# Patient Record
Sex: Female | Born: 1970 | Race: White | Hispanic: No | Marital: Single | Smoking: Never smoker
Health system: Southern US, Community
[De-identification: ages and names within clinical notes are randomized; demographics above are authoritative.]

## PROBLEM LIST (undated history)

## (undated) DIAGNOSIS — F419 Anxiety disorder, unspecified: Secondary | ICD-10-CM

## (undated) DIAGNOSIS — G4700 Insomnia, unspecified: Secondary | ICD-10-CM

## (undated) DIAGNOSIS — R519 Headache, unspecified: Secondary | ICD-10-CM

## (undated) DIAGNOSIS — N920 Excessive and frequent menstruation with regular cycle: Secondary | ICD-10-CM

## (undated) DIAGNOSIS — J45909 Unspecified asthma, uncomplicated: Secondary | ICD-10-CM

## (undated) DIAGNOSIS — D649 Anemia, unspecified: Secondary | ICD-10-CM

## (undated) DIAGNOSIS — F32A Depression, unspecified: Secondary | ICD-10-CM

## (undated) HISTORY — PX: TONSILLECTOMY: SUR1361

---

## 2003-06-22 ENCOUNTER — Other Ambulatory Visit: Admission: RE | Admit: 2003-06-22 | Discharge: 2003-06-22 | Payer: Self-pay | Admitting: Obstetrics and Gynecology

## 2004-08-05 ENCOUNTER — Other Ambulatory Visit: Admission: RE | Admit: 2004-08-05 | Discharge: 2004-08-05 | Payer: Self-pay | Admitting: *Deleted

## 2017-12-08 ENCOUNTER — Other Ambulatory Visit: Payer: Self-pay | Admitting: Family Medicine

## 2017-12-08 DIAGNOSIS — Z1231 Encounter for screening mammogram for malignant neoplasm of breast: Secondary | ICD-10-CM

## 2018-05-14 ENCOUNTER — Other Ambulatory Visit: Payer: Self-pay | Admitting: Family Medicine

## 2018-05-14 DIAGNOSIS — Z1231 Encounter for screening mammogram for malignant neoplasm of breast: Secondary | ICD-10-CM

## 2018-09-17 ENCOUNTER — Ambulatory Visit
Admission: RE | Admit: 2018-09-17 | Discharge: 2018-09-17 | Disposition: A | Discharge status: Home / Self Care | Payer: Federal, State, Local not specified - PPO | Source: Ambulatory Visit | Attending: Family Medicine | Admitting: Family Medicine

## 2018-09-17 DIAGNOSIS — Z1231 Encounter for screening mammogram for malignant neoplasm of breast: Secondary | ICD-10-CM | POA: Insufficient documentation

## 2020-05-28 ENCOUNTER — Other Ambulatory Visit: Payer: Self-pay | Admitting: Family Medicine

## 2020-06-27 ENCOUNTER — Other Ambulatory Visit: Payer: Self-pay | Admitting: Family Medicine

## 2020-06-27 DIAGNOSIS — Z1231 Encounter for screening mammogram for malignant neoplasm of breast: Secondary | ICD-10-CM

## 2020-07-16 ENCOUNTER — Other Ambulatory Visit: Payer: Self-pay

## 2020-07-16 ENCOUNTER — Ambulatory Visit
Admission: RE | Admit: 2020-07-16 | Discharge: 2020-07-16 | Disposition: A | Discharge status: Home / Self Care | Payer: Federal, State, Local not specified - PPO | Source: Ambulatory Visit | Attending: Family Medicine | Admitting: Family Medicine

## 2020-07-16 DIAGNOSIS — Z1231 Encounter for screening mammogram for malignant neoplasm of breast: Secondary | ICD-10-CM | POA: Insufficient documentation

## 2020-09-25 IMAGING — MG DIGITAL SCREENING BILATERAL MAMMOGRAM WITH TOMO AND CAD
8 series · 8 of 24 positions shown · non-contrast
Comparison: Previous exam(s).

CLINICAL DATA: Screening.

EXAM:
DIGITAL SCREENING BILATERAL MAMMOGRAM WITH TOMO AND CAD

[R MLO synth-2D]
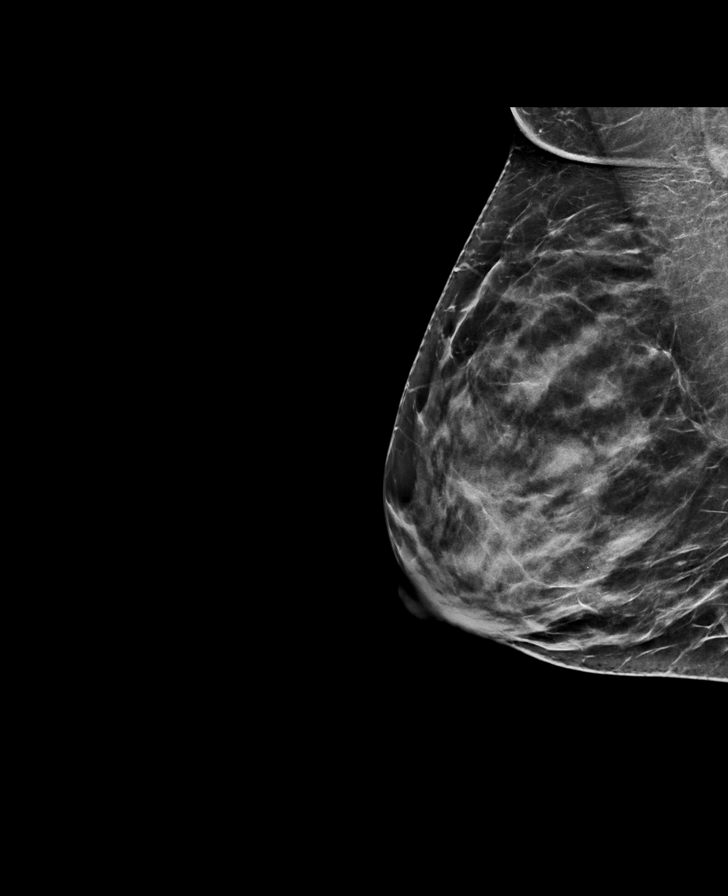

[L MLO synth-2D]
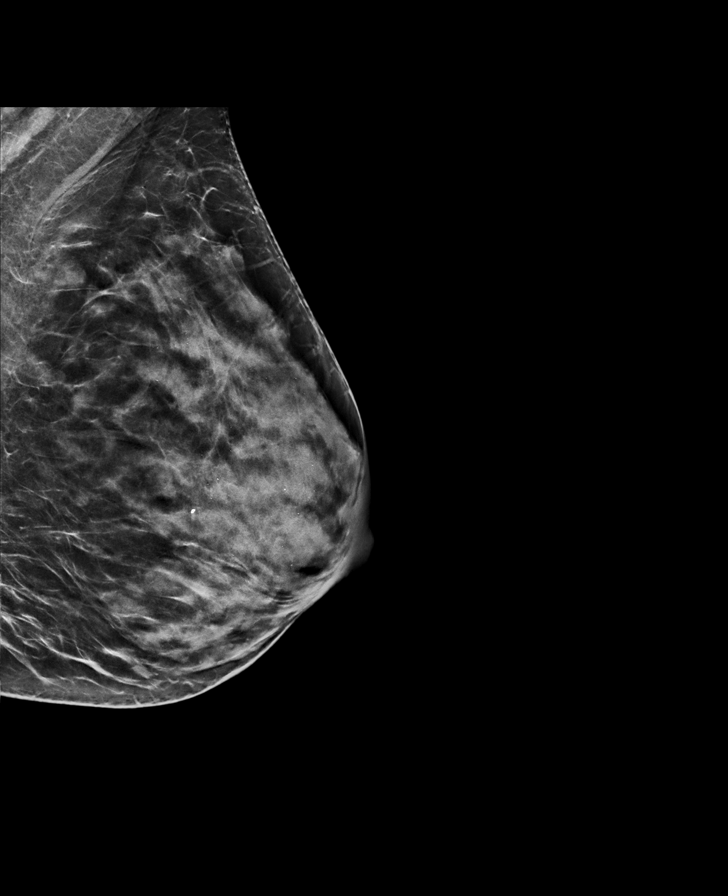

[L CC synth-2D]
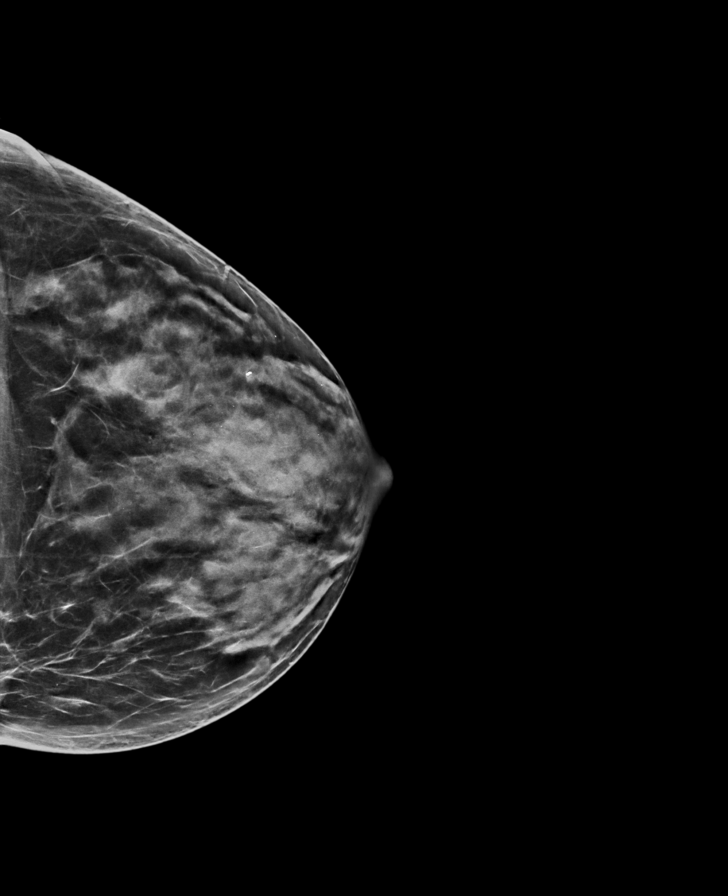

[R CC synth-2D]
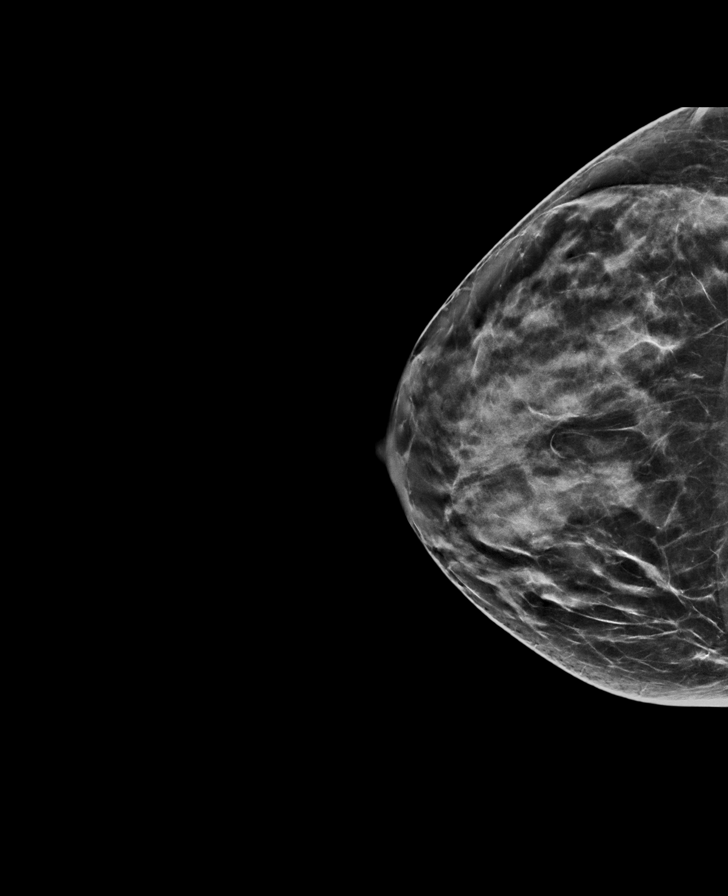

[R MLO tomo · tomo slice 34/67.0]
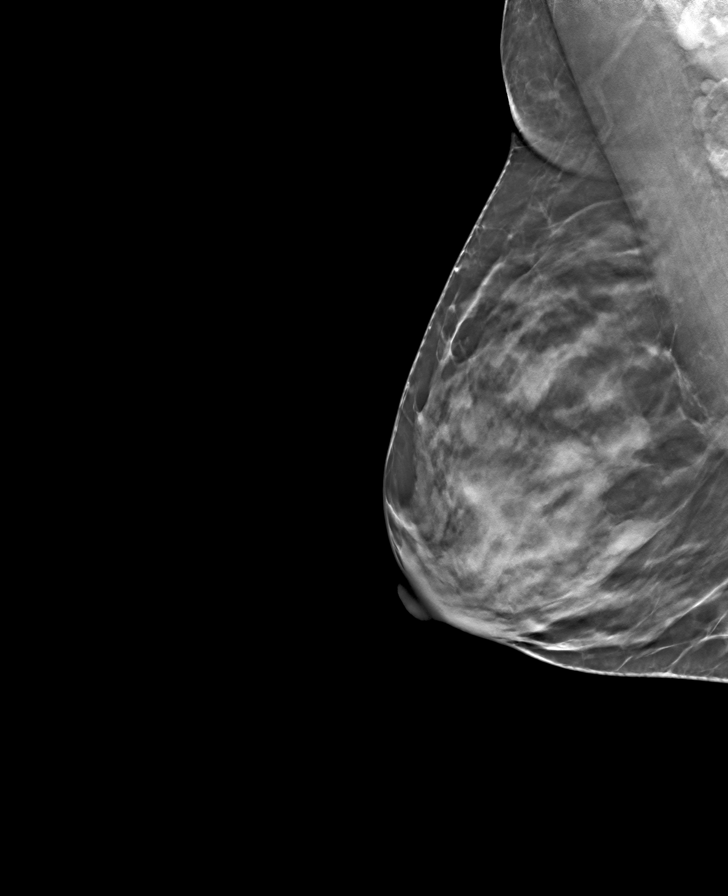

[L CC tomo · tomo slice 35/70.0]
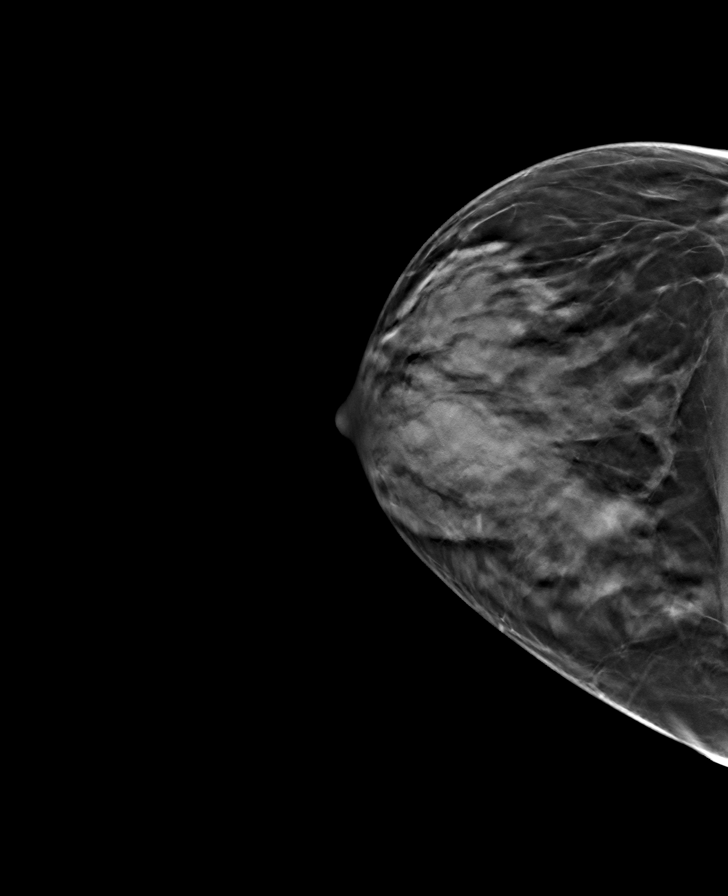

[R CC tomo · tomo slice 37/72.0]
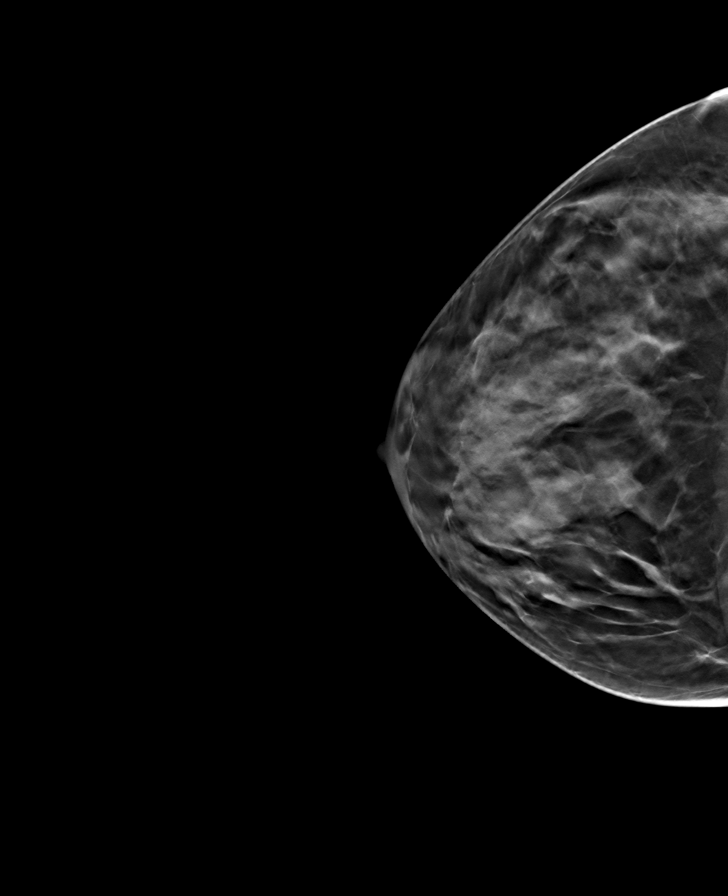

[L MLO tomo · tomo slice 35/68.0]
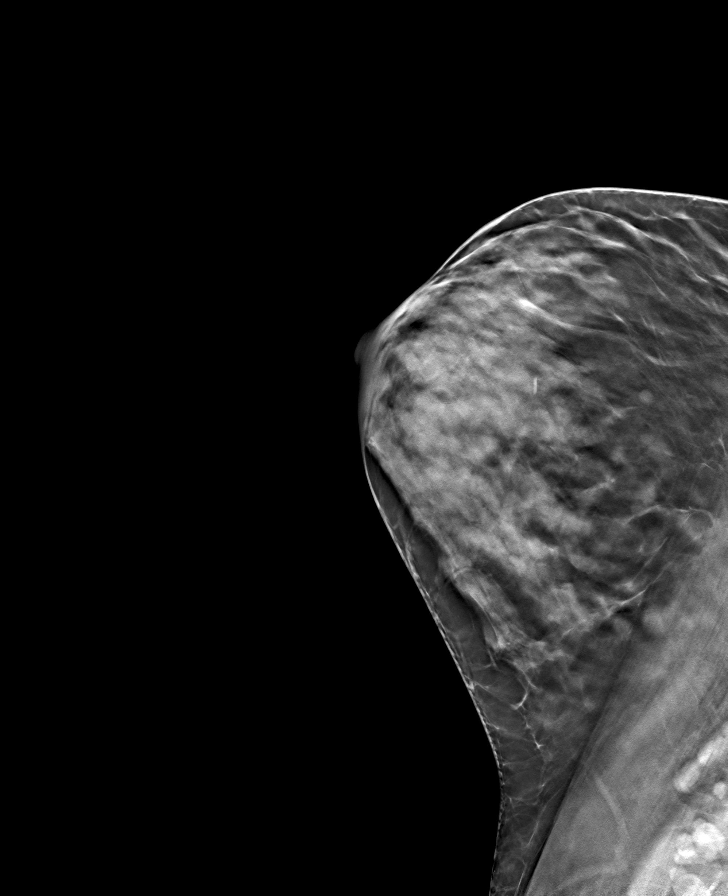

[8 of 24 positions shown; findings below may reference images not displayed]

ACR Breast Density Category c: The breast tissue is heterogeneously
dense, which may obscure small masses.
FINDINGS: There are no findings suspicious for malignancy. Images were
processed with CAD.
IMPRESSION: No mammographic evidence of malignancy. A result letter of this
screening mammogram will be mailed directly to the patient.

RECOMMENDATION:
Screening mammogram in one year. (Code:[id number]-U-LHB)

BI-RADS CATEGORY  1: Negative.

## 2021-10-30 ENCOUNTER — Other Ambulatory Visit: Payer: Self-pay | Admitting: Family Medicine

## 2021-10-30 DIAGNOSIS — Z1231 Encounter for screening mammogram for malignant neoplasm of breast: Secondary | ICD-10-CM

## 2021-11-05 ENCOUNTER — Other Ambulatory Visit: Payer: Self-pay | Admitting: Family Medicine

## 2021-11-05 DIAGNOSIS — N63 Unspecified lump in unspecified breast: Secondary | ICD-10-CM

## 2021-11-22 ENCOUNTER — Ambulatory Visit
Admission: RE | Admit: 2021-11-22 | Discharge: 2021-11-22 | Disposition: A | Discharge status: Home / Self Care | Payer: Federal, State, Local not specified - PPO | Source: Ambulatory Visit | Attending: Family Medicine | Admitting: Family Medicine

## 2021-11-22 DIAGNOSIS — N63 Unspecified lump in unspecified breast: Secondary | ICD-10-CM

## 2022-02-26 ENCOUNTER — Ambulatory Visit: Payer: Federal, State, Local not specified - PPO | Admitting: Dermatology

## 2022-05-16 ENCOUNTER — Encounter: Payer: Self-pay | Admitting: *Deleted

## 2022-05-22 ENCOUNTER — Encounter: Payer: Self-pay | Admitting: *Deleted

## 2022-05-23 ENCOUNTER — Encounter: Payer: Self-pay | Admitting: Registered Nurse

## 2022-05-23 ENCOUNTER — Encounter
Admission: RE | Disposition: A | Discharge status: Home / Self Care | Payer: Self-pay | Source: Home / Self Care | Attending: Gastroenterology

## 2022-05-23 ENCOUNTER — Ambulatory Visit
Admission: RE | Admit: 2022-05-23 | Discharge: 2022-05-23 | Disposition: A | Discharge status: Home / Self Care | Payer: Federal, State, Local not specified - PPO | Attending: Gastroenterology | Admitting: Gastroenterology

## 2022-05-23 ENCOUNTER — Encounter: Payer: Self-pay | Admitting: *Deleted

## 2022-05-23 DIAGNOSIS — Z1211 Encounter for screening for malignant neoplasm of colon: Secondary | ICD-10-CM | POA: Insufficient documentation

## 2022-05-23 DIAGNOSIS — D123 Benign neoplasm of transverse colon: Secondary | ICD-10-CM | POA: Insufficient documentation

## 2022-05-23 HISTORY — DX: Unspecified asthma, uncomplicated: J45.909

## 2022-05-23 HISTORY — DX: Insomnia, unspecified: G47.00

## 2022-05-23 HISTORY — PX: COLONOSCOPY: SHX5424

## 2022-05-23 HISTORY — DX: Depression, unspecified: F32.A

## 2022-05-23 HISTORY — DX: Anxiety disorder, unspecified: F41.9

## 2022-05-23 SURGERY — COLONOSCOPY

## 2022-05-23 MED ORDER — SODIUM CHLORIDE 0.9 % IV SOLN: INTRAVENOUS | Status: DC

## 2022-05-23 NOTE — H&P (Signed)
Outpatient short stay form Pre-procedure 05/23/2022  Regis Bill, MD  Primary Physician: Dorothey Baseman, MD  Reason for visit:  Screening  History of present illness:    52 y/o lady here for index screening colonoscopy. No family history of GI malignancies. No abdominal surgeries. No blood thinners. Patient has requested no sedation so will perform procedure with anesthesia.    Current Facility-Administered Medications:    0.9 %  sodium chloride infusion, , Intravenous, Continuous, Hernandez Losasso, Rossie Muskrat, MD  Medications Prior to Admission  Medication Sig Dispense Refill Last Dose   albuterol (VENTOLIN HFA) 108 (90 Base) MCG/ACT inhaler Inhale 2 puffs into the lungs every 4 (four) hours as needed for wheezing or shortness of breath.      hydrOXYzine (ATARAX) 10 MG tablet Take 10 mg by mouth 2 (two) times daily as needed for itching or anxiety (1-5 tablets by mouth one to two times daily as needed).      minoxidil (LONITEN) 2.5 MG tablet Take 2.5 mg by mouth every other day. Take 1/2 - 1 tablet by mouth every other day      Multiple Vitamin (MULTIVITAMIN) capsule Take 1 capsule by mouth daily.      sertraline (ZOLOFT) 100 MG tablet Take 100 mg by mouth daily.      zolpidem (AMBIEN) 5 MG tablet Take 5 mg by mouth at bedtime as needed for sleep (No more than 3 weekly).        No Known Allergies   Past Medical History:  Diagnosis Date   Anxiety    Asthma    Depression    Insomnia     Review of systems:  Otherwise negative.    Physical Exam  Gen: Alert, oriented. Appears stated age.  HEENT: PERRLA. Lungs: No respiratory distress CV: RRR Abd: soft, benign, no masses Ext: No edema    Planned procedures: Proceed with colonoscopy. The patient understands the nature of the planned procedure, indications, risks, alternatives and potential complications including but not limited to bleeding, infection, perforation, damage to internal organs and possible oversedation/side  effects from anesthesia. The patient agrees and gives consent to proceed.  Please refer to procedure notes for findings, recommendations and patient disposition/instructions.     Regis Bill, MD [facility] Gastroenterology

## 2022-05-23 NOTE — Interval H&P Note (Signed)
History and Physical Interval Note:  05/23/2022 2:05 PM  [name] [name]  has presented today for surgery, with the diagnosis of Colon Cancer screening.  The various methods of treatment have been discussed with the patient and family. After consideration of risks, benefits and other options for treatment, the patient has consented to  Procedure(s): COLONOSCOPY (N/A) as a surgical intervention.  The patient's history has been reviewed, patient examined, no change in status, stable for surgery.  I have reviewed the patient's chart and labs.  Questions were answered to the patient's satisfaction.     Regis Bill  Ok to proceed with colonoscopy

## 2022-05-23 NOTE — Op Note (Addendum)
[facility] Gastroenterology Patient Name: [name] [name] Procedure Date: 05/23/2022 2:10 PM MRN: [id number] Account #: [id number] Date of Birth: 1971-01-26 Admit Type: Outpatient Age: 52 Room: [facility] ENDO ROOM 3 Gender: Female Note Status: Finalized Instrument Name: Peds Colonoscope [id number] Procedure:             Colonoscopy Indications:           Screening for colorectal malignant neoplasm Providers:             Eather Colas MD, MD Referring MD:          Teena Irani. Terance Hart, MD (Referring MD) Medicines:             Monitored Anesthesia Care Complications:         No immediate complications. Estimated blood loss:                         Minimal. Procedure:             Pre-Anesthesia Assessment:                        - Prior to the procedure, a History and Physical was                         performed, and patient medications and allergies were                         reviewed. The patient is competent. The risks and                         benefits of the procedure and the sedation options and                         risks were discussed with the patient. All questions                         were answered and informed consent was obtained.                         Patient identification and proposed procedure were                         verified by the physician, the nurse and the                         technician in the endoscopy suite. Mental Status                         Examination: alert and oriented. Airway Examination:                         normal oropharyngeal airway and neck mobility.                         Respiratory Examination: clear to auscultation. CV                         Examination: normal. Prophylactic Antibiotics: The  patient does not require prophylactic antibiotics.                         Prior Anticoagulants: The patient has taken no                         anticoagulant or antiplatelet agents. ASA Grade                          Assessment: I - A normal, healthy patient. After                         reviewing the risks and benefits, the patient was                         deemed in satisfactory condition to undergo the                         procedure. The anesthesia plan was to use no sedation                         or anesthesia. Immediately prior to administration of                         medications, the patient was re-assessed for adequacy                         to receive sedatives. The heart rate, respiratory                         rate, oxygen saturations, blood pressure, adequacy of                         pulmonary ventilation, and response to care were                         monitored throughout the procedure. The physical                         status of the patient was re-assessed after the                         procedure.                        After obtaining informed consent, the colonoscope was                         passed under direct vision. Throughout the procedure,                         the patient's blood pressure, pulse, and oxygen                         saturations were monitored continuously. The                         Colonoscope was introduced through the anus and  advanced to the the terminal ileum. The colonoscopy                         was performed without difficulty. The patient                         tolerated the procedure well. The quality of the bowel                         preparation was good. The terminal ileum, ileocecal                         valve, appendiceal orifice, and rectum were                         photographed. Findings:      The perianal and digital rectal examinations were normal.      The terminal ileum appeared normal.      A 2 mm polyp was found in the transverse colon. The polyp was sessile.       The polyp was removed with a cold snare. Resection and retrieval were       complete.  Estimated blood loss was minimal.      The exam was otherwise without abnormality on direct and retroflexion       views. Impression:            - The examined portion of the ileum was normal.                        - One 2 mm polyp in the transverse colon, removed with                         a cold snare. Resected and retrieved.                        - The examination was otherwise normal on direct and                         retroflexion views. Recommendation:        - Discharge patient to home.                        - Resume previous diet.                        - Continue present medications.                        - Await pathology results.                        - Repeat colonoscopy for surveillance based on                         pathology results.                        - Return to referring physician as previously  scheduled. Procedure Code(s):     --- Professional ---                        [phone], Colonoscopy, flexible; with removal of                         tumor(s), polyp(s), or other lesion(s) by snare                         technique Diagnosis Code(s):     --- Professional ---                        Z12.11, Encounter for screening for malignant neoplasm                         of colon                        D12.3, Benign neoplasm of transverse colon (hepatic                         flexure or splenic flexure) CPT copyright 2022 American Medical Association. All rights reserved. The codes documented in this report are preliminary and upon coder review may  be revised to meet current compliance requirements. Eather Colas MD, MD 05/23/2022 2:49:10 PM Number of Addenda: 0 Note Initiated On: 05/23/2022 2:10 PM Scope Withdrawal Time: 0 hours 18 minutes 15 seconds  Total Procedure Duration: 0 hours 28 minutes 24 seconds  Estimated Blood Loss:  Estimated blood loss was minimal.      [facility]

## 2022-05-26 ENCOUNTER — Encounter: Payer: Self-pay | Admitting: Gastroenterology

## 2022-05-27 LAB — SURGICAL PATHOLOGY

## 2023-09-01 NOTE — H&P (Signed)
 [name] [name] is a 53 y.o. female here for [facility] and bilateral salpingectomy . Pt returns today to rediscuss her options for treating her menorrhagia  and endometrial polyps   Pt here for MMR for several yrs worsening recently  No vasomotor . [facility] 9/24 wnl ( not menopausal) Prior h/o right bartholin gland cyst.   Embx   Cxbx :  Comment: Part A-Endometrial Biopsy: BENIGN ENDOMETRIAL POLYP. MILDLY DISORDERED PROLIFERATIVE PHASE ENDOMETRIUM. NO ATYPIA OR MALIGNANCY. Part B-Cervical Polyp(s): BENIGN ENDOCERVICAL POLYP.    S/p one SVD    Past Medical History:  has a past medical history of Acne, Allergic state, Alopecia areata, ASCUS with positive high risk HPV (10/2008), Asthma without status asthmaticus (HHS-HCC), Depressive disorder, not elsewhere classified, Encounter for long-term (current) use of other medications, GAD (generalized anxiety disorder), Insomnia, and Mammogram abnormal (09/15/2012 ).  Past Surgical History:  has a past surgical history that includes Tonsillectomy and Colon @ [facility] (05/23/2022). Family History: family history includes Alcohol abuse in her father; Anxiety in her father; Brain cancer in her father; Breast cancer in her paternal aunt; Depression in her mother; High blood pressure (Hypertension) in her father; Osteoarthritis in her mother. Social History:  reports that she has never smoked. She has never used smokeless tobacco. She reports that she does not drink alcohol and does not use drugs. OB/GYN History:  OB History       Gravida  2   Para  1   Term      Preterm      AB  1   Living  1        SAB      IAB      Ectopic      Molar      Multiple      Live Births  1             Allergies: has no known allergies. Medications:  Current Medications    Current Outpatient Medications:    hydrOXYzine HCL (ATARAX) 10 MG tablet, TAKE 1 TO 5 TABLETS BY MOUTH ONE TO TWO TIMES DAILY AS NEEDED, Disp: 450 tablet, Rfl: 1   multivitamin tablet,  Take 1 tablet by mouth daily., Disp: , Rfl:    omega-3 fatty acids 1,000 mg capsule, Take 1 g by mouth 2 (two) times daily., Disp: , Rfl:    sertraline (ZOLOFT) 100 MG tablet, TAKE ONE AND A HALF TABLETS BY MOUTH EVERY DAY, Disp: 135 tablet, Rfl: 1   SUMAtriptan (IMITREX) 100 MG tablet, Take 1 tablet (100 mg total) by mouth as directed for Migraine May take a second dose after 2 hours if needed., Disp: 9 tablet, Rfl: 0   zolpidem (AMBIEN) 5 MG tablet, Take 1 Tablet PO nightly PRN. AVOID NIGHTLY USE. NO MORE THAN 3 WEEKLY., Disp: 20 tablet, Rfl: 0   albuterol 90 mcg/actuation inhaler, Inhale 2 inhalations into the lungs every 4 (four) hours as needed for Wheezing, Disp: 1 Inhaler, Rfl: 0     Review of Systems: General:                      No fatigue or weight loss Eyes:                           No vision changes Ears:  No hearing difficulty Respiratory:                No cough or shortness of breath Pulmonary:                  No asthma or shortness of breath Cardiovascular:           No chest pain, palpitations, dyspnea on exertion Gastrointestinal:          No abdominal bloating, chronic diarrhea, constipations, masses, pain or hematochezia Genitourinary:             No hematuria, dysuria, abnormal vaginal discharge, pelvic pain, +Menometrorrhagia Lymphatic:                   No swollen lymph nodes Musculoskeletal:No muscle weakness Neurologic:                  No extremity weakness, syncope, seizure disorder Psychiatric:                  No history of depression, delusions or suicidal/homicidal ideation      Exam:       Vitals:    08/31/23 1616  BP: 107/71  Pulse: 101      Body mass index is 24.79 kg/m.   WDWN white/ female in NAD   Lungs: CTA  CV : RRR without murmur   Breast: exam done in sitting and lying position : No dimpling or retraction, no dominant mass, no spontaneous discharge, no axillary adenopathy Neck:  no thyromegaly Abdomen: soft  , no mass, normal active bowel sounds,  non-tender, no rebound tenderness Pelvic: tanner stage 5 ,  External genitalia: vulva /labia no lesions Urethra: no prolapse Vagina: normal physiologic d/c, adequate room for a TVH  Cervix: no lesions, no cervical motion tenderness   Uterus: normal size shape and contour, non-tender Adnexa: no mass,  non-tender   Rectovaginal:    Impression:    The primary encounter diagnosis was Menorrhagia with irregular cycle. A diagnosis of Endometrial polyp was also pertinent to this visit.       Plan:   TVH and BS. Possible conversion to open procedure  40 min discussion with the options spelled out in detail  with both surgeries. Pros and Cons discussed . Removal of ovaries at 53 is an option , but she wishes to not have them removed . Fallopian tubes ( salpingectomy - yes) Benefits and risks to surgery: The proposed benefit of the surgery has been discussed with the patient. The possible risks include, but are not limited to: organ injury to the bowel , bladder, ureters, and major blood vessels and nerves. There is a possibility of additional surgeries resulting from these injuries. There is also the risk of blood transfusion and the need to receive blood products during or after the procedure which may rarely lead to HIV or Hepatitis C infection. There is a risk of developing a deep venous thrombosis or a pulmonary embolism . There is the possibility of wound infection and also anesthetic complications, even the rare possibility of death. The patient understands these risks and wishes to proceed. All questions have been answered and the consent has been signed.        No follow-ups on file.   DEBBY CLARYCE DINSMORE, MD

## 2023-09-02 ENCOUNTER — Encounter: Payer: Self-pay | Admitting: Urgent Care

## 2023-09-02 ENCOUNTER — Encounter
Admission: RE | Admit: 2023-09-02 | Discharge: 2023-09-02 | Disposition: A | Discharge status: Home / Self Care | Source: Ambulatory Visit | Attending: Obstetrics and Gynecology | Admitting: Obstetrics and Gynecology

## 2023-09-02 ENCOUNTER — Other Ambulatory Visit: Payer: Self-pay

## 2023-09-02 VITALS — Ht 65.0 in | Wt 149.0 lb

## 2023-09-02 DIAGNOSIS — Z01812 Encounter for preprocedural laboratory examination: Secondary | ICD-10-CM | POA: Insufficient documentation

## 2023-09-02 DIAGNOSIS — Z01818 Encounter for other preprocedural examination: Secondary | ICD-10-CM

## 2023-09-02 DIAGNOSIS — F418 Other specified anxiety disorders: Secondary | ICD-10-CM | POA: Diagnosis not present

## 2023-09-02 DIAGNOSIS — N920 Excessive and frequent menstruation with regular cycle: Secondary | ICD-10-CM

## 2023-09-02 DIAGNOSIS — N80203 Endometriosis of bilateral fallopian tubes, unspecified depth: Secondary | ICD-10-CM | POA: Diagnosis not present

## 2023-09-02 DIAGNOSIS — N840 Polyp of corpus uteri: Secondary | ICD-10-CM | POA: Diagnosis present

## 2023-09-02 DIAGNOSIS — D250 Submucous leiomyoma of uterus: Secondary | ICD-10-CM | POA: Diagnosis not present

## 2023-09-02 DIAGNOSIS — N921 Excessive and frequent menstruation with irregular cycle: Secondary | ICD-10-CM | POA: Diagnosis present

## 2023-09-02 HISTORY — DX: Excessive and frequent menstruation with regular cycle: N92.0

## 2023-09-02 HISTORY — DX: Anemia, unspecified: D64.9

## 2023-09-02 HISTORY — DX: Headache, unspecified: R51.9

## 2023-09-02 LAB — CBC
HCT: 31.2 % — ABNORMAL LOW (ref 36.0–46.0)
Hemoglobin: 9.8 g/dL — ABNORMAL LOW (ref 12.0–15.0)
MCH: 24.3 pg — ABNORMAL LOW (ref 26.0–34.0)
MCHC: 31.4 g/dL (ref 30.0–36.0)
MCV: 77.4 fL — ABNORMAL LOW (ref 80.0–100.0)
Platelets: 377 K/uL (ref 150–400)
RBC: 4.03 MIL/uL (ref 3.87–5.11)
RDW: 16.5 % — ABNORMAL HIGH (ref 11.5–15.5)
WBC: 5 K/uL (ref 4.0–10.5)
nRBC: 0 % (ref 0.0–0.2)

## 2023-09-02 LAB — TYPE AND SCREEN
ABO/RH(D): AB POS
Antibody Screen: NEGATIVE

## 2023-09-02 LAB — BASIC METABOLIC PANEL WITH GFR
Anion gap: 10 (ref 5–15)
BUN: 13 mg/dL (ref 6–20)
CO2: 24 mmol/L (ref 22–32)
Calcium: 9.6 mg/dL (ref 8.9–10.3)
Chloride: 106 mmol/L (ref 98–111)
Creatinine, Ser: 0.75 mg/dL (ref 0.44–1.00)
GFR, Estimated: >60 mL/min (ref >60)
Glucose, Bld: 114 mg/dL — ABNORMAL HIGH (ref 70–99)
Potassium: 3.8 mmol/L (ref 3.5–5.1)
Sodium: 140 mmol/L (ref 135–145)

## 2023-09-02 MED ORDER — ACETAMINOPHEN 500 MG PO TABS
1000.0000 mg | ORAL_TABLET | ORAL | Status: AC
  Administered 2023-09-03: 1000 mg via ORAL

## 2023-09-02 MED ORDER — LACTATED RINGERS IV SOLN: INTRAVENOUS | Status: DC

## 2023-09-02 MED ORDER — METRONIDAZOLE 500 MG/100ML IV SOLN
500.0000 mg | Freq: Once | INTRAVENOUS | Status: AC
  Administered 2023-09-03: 500 mg via INTRAVENOUS
  Filled 2023-09-02: qty 100

## 2023-09-02 MED ORDER — CEFAZOLIN SODIUM-DEXTROSE 2-4 GM/100ML-% IV SOLN
2.0000 g | Freq: Once | INTRAVENOUS | Status: AC
  Administered 2023-09-03 (×2): 2 g via INTRAVENOUS

## 2023-09-02 MED ORDER — CHLORHEXIDINE GLUCONATE 0.12 % MT SOLN
15.0000 mL | Freq: Once | OROMUCOSAL | Status: AC
  Administered 2023-09-03: 15 mL via OROMUCOSAL

## 2023-09-02 MED ORDER — POVIDONE-IODINE 10 % EX SWAB: 2.0000 | Freq: Once | CUTANEOUS | Status: DC

## 2023-09-02 MED ORDER — ORAL CARE MOUTH RINSE: 15.0000 mL | Freq: Once | OROMUCOSAL | Status: AC

## 2023-09-02 NOTE — Patient Instructions (Addendum)
 Your procedure is scheduled on: 09/03/2023 Thursday Report to the Registration Desk on the 1st floor of the [facility]. To find out your arrival time, please call [phone] between 1PM - 3PM on: 09/02/2023 Wednesday If your arrival time is 6:00 am, do not arrive before that time as the Medical Mall entrance doors do not open until 6:00 am.  REMEMBER: Instructions that are not followed completely may result in serious medical risk, up to and including death; or upon the discretion of your surgeon and anesthesiologist your surgery may need to be rescheduled.  Do not eat food after midnight the night before surgery.  No gum chewing or hard candies.  You may however, drink CLEAR liquids up to 2 hours before you are scheduled to arrive for your surgery. Do not drink anything within 2 hours of your scheduled arrival time.  Clear liquids include: - water  - apple juice without pulp - gatorade (not RED colors) - black coffee or tea (Do NOT add milk or creamers to the coffee or tea) Do NOT drink anything that is not on this list.    In addition, your doctor has ordered for you to drink the provided:  Ensure Pre-Surgery Clear Carbohydrate Drink   Drinking this carbohydrate drink up to two hours before surgery helps to reduce insulin resistance and improve patient outcomes. Please complete drinking 2 hours before scheduled arrival time.  One week prior to surgery: Stop Anti-inflammatories (NSAIDS) such as Advil, Aleve, Ibuprofen, Motrin, Naproxen, Naprosyn and Aspirin based products such as Excedrin, Goody's Powder, BC Powder. Stop ANY OVER THE COUNTER supplements until after surgery.  You may however, continue to take Tylenol  if needed for pain up until the day of surgery.   Continue taking all of your other prescription medications up until the day of surgery.  ON THE DAY OF SURGERY ONLY TAKE THESE MEDICATIONS WITH SIPS OF WATER:  SUMAtriptan (IMITREX) as needed hydrOXYzine  (ATARAX) as needed Ambien as needed  Use inhalers on the day of surgery and bring to the hospital.  No Alcohol for 24 hours before or after surgery.  No Smoking including e-cigarettes for 24 hours before surgery.  No chewable tobacco products for at least 6 hours before surgery.  No nicotine patches on the day of surgery.  Do not use any recreational drugs for at least a week (preferably 2 weeks) before your surgery.  Please be advised that the combination of cocaine and anesthesia may have negative outcomes, up to and including death. If you test positive for cocaine, your surgery will be cancelled.  On the morning of surgery brush your teeth with toothpaste and water, you may rinse your mouth with mouthwash if you wish. Do not swallow any toothpaste or mouthwash.  You need to shower on day of surgery.  Do not wear jewelry, make-up, hairpins, clips or nail polish.  Do not wear lotions, powders, or perfumes.   Do not shave body hair from the neck down 48 hours before surgery.  Contact lenses, hearing aids and dentures may not be worn into surgery.  Do not bring valuables to the hospital. [facility] is not responsible for any missing/lost belongings or valuables.   Notify your doctor if there is any change in your medical condition (cold, fever, infection).  Wear comfortable clothing (specific to your surgery type) to the hospital.  After surgery, you can help prevent lung complications by doing breathing exercises.  Take deep breaths and cough every 1-2 hours. Your doctor  may order a device called an Incentive Spirometer to help you take deep breaths. When coughing or sneezing, hold a pillow firmly against your incision with both hands. This is called "splinting." Doing this helps protect your incision. It also decreases belly discomfort.  If you are being admitted to the hospital overnight, leave your suitcase in the car. After surgery it may be brought to your room.  In  case of increased patient census, it may be necessary for you, the patient, to continue your postoperative care in the Same Day Surgery department.  If you are being discharged the day of surgery, you will not be allowed to drive home. You will need a responsible individual to drive you home and stay with you for 24 hours after surgery.   If you are taking public transportation, you will need to have a responsible individual with you.  Please call the Pre-admissions Testing Dept. at [phone] if you have any questions about these instructions.  Surgery Visitation Policy:  Patients having surgery or a procedure may have two visitors.  Children under the age of 66 must have an adult with them who is not the patient.   Merchandiser, retail to address health-related social needs:  [email or web address]

## 2023-09-03 ENCOUNTER — Encounter: Payer: Self-pay | Admitting: Obstetrics and Gynecology

## 2023-09-03 ENCOUNTER — Other Ambulatory Visit: Payer: Self-pay

## 2023-09-03 ENCOUNTER — Ambulatory Visit
Admission: RE | Admit: 2023-09-03 | Discharge: 2023-09-03 | Disposition: A | Discharge status: Home / Self Care | Attending: Obstetrics and Gynecology | Admitting: Obstetrics and Gynecology

## 2023-09-03 ENCOUNTER — Encounter
Admission: RE | Disposition: A | Discharge status: Home / Self Care | Payer: Self-pay | Source: Home / Self Care | Attending: Obstetrics and Gynecology

## 2023-09-03 ENCOUNTER — Ambulatory Visit: Admitting: Anesthesiology

## 2023-09-03 DIAGNOSIS — Z01818 Encounter for other preprocedural examination: Secondary | ICD-10-CM

## 2023-09-03 DIAGNOSIS — D250 Submucous leiomyoma of uterus: Secondary | ICD-10-CM | POA: Insufficient documentation

## 2023-09-03 DIAGNOSIS — N921 Excessive and frequent menstruation with irregular cycle: Secondary | ICD-10-CM | POA: Insufficient documentation

## 2023-09-03 DIAGNOSIS — F418 Other specified anxiety disorders: Secondary | ICD-10-CM | POA: Insufficient documentation

## 2023-09-03 DIAGNOSIS — N80203 Endometriosis of bilateral fallopian tubes, unspecified depth: Secondary | ICD-10-CM | POA: Insufficient documentation

## 2023-09-03 DIAGNOSIS — N840 Polyp of corpus uteri: Secondary | ICD-10-CM | POA: Insufficient documentation

## 2023-09-03 HISTORY — PX: HYSTERECTOMY, VAGINAL, WITH SALPINGECTOMY: SHX7588

## 2023-09-03 LAB — POCT PREGNANCY, URINE: Preg Test, Ur: NEGATIVE

## 2023-09-03 LAB — ABO/RH: ABO/RH(D): AB POS

## 2023-09-03 SURGERY — HYSTERECTOMY, VAGINAL, WITH SALPINGECTOMY
Anesthesia: Spinal | Site: Uterus | Laterality: Bilateral

## 2023-09-03 MED ORDER — CEFAZOLIN SODIUM-DEXTROSE 2-4 GM/100ML-% IV SOLN
INTRAVENOUS | Status: AC
  Filled 2023-09-03: qty 100

## 2023-09-03 MED ORDER — BUPIVACAINE HCL (PF) 0.5 % IJ SOLN
INTRAMUSCULAR | Status: AC
  Filled 2023-09-03: qty 10

## 2023-09-03 MED ORDER — ONDANSETRON HCL 4 MG/2ML IJ SOLN
INTRAMUSCULAR | Status: DC | PRN
Start: 2023-09-03 — End: 2023-09-03
  Administered 2023-09-03: 4 mg via INTRAVENOUS

## 2023-09-03 MED ORDER — ACETAMINOPHEN 500 MG PO TABS
ORAL_TABLET | ORAL | Status: AC
  Filled 2023-09-03: qty 2

## 2023-09-03 MED ORDER — DEXAMETHASONE SODIUM PHOSPHATE 4 MG/ML IJ SOLN
INTRAMUSCULAR | Status: DC | PRN
  Administered 2023-09-03: 4 mg via INTRAVENOUS

## 2023-09-03 MED ORDER — BUPIVACAINE HCL (PF) 0.5 % IJ SOLN
INTRAMUSCULAR | Status: DC | PRN
  Administered 2023-09-03: 3 mL

## 2023-09-03 MED ORDER — ONDANSETRON HCL 4 MG/2ML IJ SOLN: 4.0000 mg | Freq: Once | INTRAMUSCULAR | Status: DC | PRN

## 2023-09-03 MED ORDER — FENTANYL CITRATE (PF) 100 MCG/2ML IJ SOLN
INTRAMUSCULAR | Status: AC
Start: 2023-09-03 — End: 2023-09-03
  Filled 2023-09-03: qty 2

## 2023-09-03 MED ORDER — LIDOCAINE-EPINEPHRINE 1 %-1:100000 IJ SOLN
INTRAMUSCULAR | Status: DC | PRN
  Administered 2023-09-03: 8 mL

## 2023-09-03 MED ORDER — OXYCODONE HCL 5 MG PO TABS
ORAL_TABLET | ORAL | Status: AC
Start: 2023-09-03 — End: 2023-09-03
  Filled 2023-09-03: qty 1

## 2023-09-03 MED ORDER — OXYCODONE HCL 5 MG/5ML PO SOLN: 5.0000 mg | Freq: Once | ORAL | Status: DC | PRN

## 2023-09-03 MED ORDER — LACTATED RINGERS IV SOLN: INTRAVENOUS | Status: DC

## 2023-09-03 MED ORDER — 0.9 % SODIUM CHLORIDE (POUR BTL) OPTIME
TOPICAL | Status: DC | PRN
  Administered 2023-09-03: 500 mL

## 2023-09-03 MED ORDER — FENTANYL CITRATE (PF) 100 MCG/2ML IJ SOLN
INTRAMUSCULAR | Status: DC | PRN
  Administered 2023-09-03 (×2): 50 ug via INTRAVENOUS

## 2023-09-03 MED ORDER — ACETAMINOPHEN 10 MG/ML IV SOLN: 1000.0000 mg | Freq: Once | INTRAVENOUS | Status: DC | PRN

## 2023-09-03 MED ORDER — OXYCODONE HCL 5 MG PO TABS: 5.0000 mg | ORAL_TABLET | Freq: Once | ORAL | Status: DC | PRN

## 2023-09-03 MED ORDER — FENTANYL CITRATE (PF) 100 MCG/2ML IJ SOLN
INTRAMUSCULAR | Status: AC
  Filled 2023-09-03: qty 2

## 2023-09-03 MED ORDER — ACETAMINOPHEN 10 MG/ML IV SOLN
INTRAVENOUS | Status: AC
  Filled 2023-09-03: qty 100

## 2023-09-03 MED ORDER — KETOROLAC TROMETHAMINE 30 MG/ML IJ SOLN
INTRAMUSCULAR | Status: DC | PRN
  Administered 2023-09-03: 30 mg via INTRAVENOUS

## 2023-09-03 MED ORDER — MIDAZOLAM HCL 5 MG/5ML IJ SOLN
INTRAMUSCULAR | Status: DC | PRN
Start: 2023-09-03 — End: 2023-09-03
  Administered 2023-09-03 (×2): 1 mg via INTRAVENOUS

## 2023-09-03 MED ORDER — DIPHENHYDRAMINE HCL 50 MG/ML IJ SOLN
INTRAMUSCULAR | Status: AC
  Filled 2023-09-03: qty 1

## 2023-09-03 MED ORDER — LIDOCAINE-EPINEPHRINE 1 %-1:100000 IJ SOLN
INTRAMUSCULAR | Status: AC
  Filled 2023-09-03: qty 1

## 2023-09-03 MED ORDER — PROPOFOL 500 MG/50ML IV EMUL
INTRAVENOUS | Status: DC | PRN
  Administered 2023-09-03: 50 ug/kg/min via INTRAVENOUS

## 2023-09-03 MED ORDER — MIDAZOLAM HCL 2 MG/2ML IJ SOLN
INTRAMUSCULAR | Status: AC
  Filled 2023-09-03: qty 2

## 2023-09-03 MED ORDER — FENTANYL CITRATE (PF) 100 MCG/2ML IJ SOLN
25.0000 ug | INTRAMUSCULAR | Status: DC | PRN
  Administered 2023-09-03 (×2): 25 ug via INTRAVENOUS

## 2023-09-03 MED ORDER — DIPHENHYDRAMINE HCL 50 MG/ML IJ SOLN
INTRAMUSCULAR | Status: DC | PRN
Start: 2023-09-03 — End: 2023-09-03
  Administered 2023-09-03: 12.5 mg via INTRAVENOUS

## 2023-09-03 MED ORDER — CHLORHEXIDINE GLUCONATE 0.12 % MT SOLN
OROMUCOSAL | Status: AC
  Filled 2023-09-03: qty 15

## 2023-09-03 MED ORDER — PROPOFOL 10 MG/ML IV BOLUS
INTRAVENOUS | Status: AC
  Filled 2023-09-03: qty 20

## 2023-09-03 SURGICAL SUPPLY — 30 items
BAG URINE DRAIN 2000ML AR STRL (UROLOGICAL SUPPLIES) IMPLANT
CATH ROBINSON RED A/P 16FR (CATHETERS) ×1 IMPLANT
CATH URTH 16FR FL 2W BLN LF (CATHETERS) IMPLANT
DRAPE PERI LITHO V/GYN (MISCELLANEOUS) ×1 IMPLANT
DRAPE SURG 17X11 SM STRL (DRAPES) ×1 IMPLANT
DRAPE UNDER BUTTOCK W/FLU (DRAPES) ×1 IMPLANT
ELECTRODE REM PT RTRN 9FT ADLT (ELECTROSURGICAL) ×1 IMPLANT
GAUZE 4X4 16PLY ~~LOC~~+RFID DBL (SPONGE) ×2 IMPLANT
GLOVE SURG SYN 8.0 PF PI (GLOVE) ×1 IMPLANT
GOWN STRL REUS W/ TWL LRG LVL3 (GOWN DISPOSABLE) ×3 IMPLANT
GOWN STRL REUS W/ TWL XL LVL3 (GOWN DISPOSABLE) ×1 IMPLANT
KIT TURNOVER CYSTO (KITS) ×1 IMPLANT
LABEL OR SOLS (LABEL) ×1 IMPLANT
MANIFOLD NEPTUNE II (INSTRUMENTS) ×1 IMPLANT
NDL HYPO 22X1.5 SAFETY MO (MISCELLANEOUS) ×1 IMPLANT
NEEDLE HYPO 22X1.5 SAFETY MO (MISCELLANEOUS) ×1 IMPLANT
NS IRRIG 1000ML POUR BTL (IV SOLUTION) ×1 IMPLANT
PACK BASIN MINOR ARMC (MISCELLANEOUS) ×1 IMPLANT
PAD OB MATERNITY 11 LF (PERSONAL CARE ITEMS) ×1 IMPLANT
PAD PREP OB/GYN DISP 24X41 (PERSONAL CARE ITEMS) ×1 IMPLANT
SOLUTION PREP PVP 2OZ (MISCELLANEOUS) ×2 IMPLANT
SURGILUBE 2OZ TUBE FLIPTOP (MISCELLANEOUS) ×1 IMPLANT
SUT PDS 2-0 27IN (SUTURE) IMPLANT
SUT VIC AB 0 CT1 27XCR 8 STRN (SUTURE) ×2 IMPLANT
SUT VIC AB 0 CT1 36 (SUTURE) ×1 IMPLANT
SUT VIC AB 2-0 SH 27XBRD (SUTURE) ×1 IMPLANT
SYR 10ML LL (SYRINGE) ×1 IMPLANT
SYR CONTROL 10ML LL (SYRINGE) ×1 IMPLANT
TRAP FLUID SMOKE EVACUATOR (MISCELLANEOUS) ×1 IMPLANT
WATER STERILE IRR 1000ML POUR (IV SOLUTION) ×1 IMPLANT

## 2023-09-03 NOTE — Anesthesia Preprocedure Evaluation (Addendum)
 Anesthesia Evaluation  Patient identified by MRN, date of birth, ID band Patient awake    Reviewed: Allergy & Precautions, H&P , NPO status , Patient's Chart, lab work & pertinent test results  History of Anesthesia Complications Negative for: history of anesthetic complications  Airway Mallampati: III  TM Distance: >3 FB Neck ROM: full    Dental no notable dental hx.    Pulmonary asthma    Pulmonary exam normal breath sounds clear to auscultation       Cardiovascular negative cardio ROS Normal cardiovascular exam Rhythm:Regular Rate:Normal     Neuro/Psych  Headaches PSYCHIATRIC DISORDERS Anxiety Depression       GI/Hepatic negative GI ROS,,,  Endo/Other  negative endocrine ROS    Renal/GU negative Renal ROS     Musculoskeletal   Abdominal   Peds  Hematology  (+) Blood dyscrasia, anemia   Anesthesia Other Findings   Reproductive/Obstetrics                              Anesthesia Physical Anesthesia Plan  ASA: 2  Anesthesia Plan: Spinal   Post-op Pain Management:    Induction: Intravenous  PONV Risk Score and Plan: 2 and Treatment may vary due to age or medical condition, Ondansetron , Midazolam  and TIVA  Airway Management Planned: Natural Airway and Nasal Cannula  Additional Equipment:   Intra-op Plan:   Post-operative Plan:   Informed Consent: I have reviewed the patients History and Physical, chart, labs and discussed the procedure including the risks, benefits and alternatives for the proposed anesthesia with the patient or authorized representative who has indicated his/her understanding and acceptance.       Plan Discussed with: CRNA  Anesthesia Plan Comments: (Plan for spinal and minimal anesthesia otherwise, per patient preference. Backup methods discussed include propofol  infusion with natural airway, LMA, GETA.  Patient consented for risks of anesthesia  including but not limited to:  - adverse reactions to medications - damage to eyes, teeth, lips or other oral mucosa - nerve damage due to positioning  - sore throat or hoarseness - headache, bleeding, infection, nerve damage 2/2 spinal - damage to heart, brain, nerves, lungs, other parts of body or loss of life  Informed patient about role of CRNA in peri- and intra-operative care.  Patient voiced understanding.)         Anesthesia Quick Evaluation

## 2023-09-03 NOTE — Brief Op Note (Signed)
 09/03/2023  3:49 PM  PATIENT:  [name] [name]  53 y.o. female  PRE-OPERATIVE DIAGNOSIS:  menorrhagia polyps  POST-OPERATIVE DIAGNOSIS:  s/p menorrhagia polyps Fibroid  PROCEDURE:  Procedure(s): HYSTERECTOMY, VAGINAL, WITH SALPINGECTOMY (Bilateral)  SURGEON:  Surgeons and Role:    * Cedar Roseman, Debby PARAS, MD - Primary    * Yasuko Lapage, Beverli GAILS, MD - Assisting  PHYSICIAN ASSISTANT: cst  ASSISTANTS: none   ANESTHESIA:   spinal  EBL:  25 mL IOF 600 ou 150 cc  BLOOD ADMINISTERED:none  DRAINS: none   LOCAL MEDICATIONS USED:  LIDOCAINE    SPECIMEN:  Source of Specimen:  cx uterus, bilateral fallopian tubes   DISPOSITION OF SPECIMEN:  PATHOLOGY  COUNTS:  YES  TOURNIQUET:  * No tourniquets in log *  DICTATION: .Other Dictation: Dictation Number verbal  PLAN OF CARE: Discharge to home after PACU  PATIENT DISPOSITION:  PACU - hemodynamically stable.   Delay start of Pharmacological VTE agent (>24hrs) due to surgical blood loss or risk of bleeding: not applicable

## 2023-09-03 NOTE — Transfer of Care (Signed)
 Immediate Anesthesia Transfer of Care Note  Patient: [name] [name]  Procedure(s) Performed: HYSTERECTOMY, VAGINAL, WITH SALPINGECTOMY (Bilateral: Uterus)  Patient Location: PACU  Anesthesia Type:MAC and Spinal  Level of Consciousness: awake, alert , and oriented  Airway & Oxygen Therapy: Patient Spontanous Breathing  Post-op Assessment: Report given to RN and Post -op Vital signs reviewed and stable  Post vital signs: stable  Last Vitals:  Vitals Value Taken Time  BP 104/61 09/03/23 16:00  Temp 36.4 C 09/03/23 16:00  Pulse 72 09/03/23 16:02  Resp 15 09/03/23 16:02  SpO2 100 % 09/03/23 16:02  Vitals shown include unfiled device data.  Last Pain:  Vitals:   09/03/23 1305  TempSrc:   PainSc: 0-No pain         Complications: No notable events documented.

## 2023-09-03 NOTE — Progress Notes (Signed)
 Schedule for [facility] , [facility] Labs reviewed . All questions answered . Proceed

## 2023-09-03 NOTE — Anesthesia Postprocedure Evaluation (Signed)
 Anesthesia Post Note  Patient: Rock Denherder  Procedure(s) Performed: HYSTERECTOMY, VAGINAL, WITH SALPINGECTOMY (Bilateral: Uterus)  Patient location during evaluation: Phase II Anesthesia Type: Spinal Level of consciousness: oriented and awake and alert Pain management: pain level controlled Vital Signs Assessment: post-procedure vital signs reviewed and stable Respiratory status: spontaneous breathing and respiratory function stable Cardiovascular status: blood pressure returned to baseline and stable Postop Assessment: no headache, no backache, no apparent nausea or vomiting and patient able to bend at knees Anesthetic complications: no   No notable events documented.   Last Vitals:  Vitals:   09/03/23 1714 09/03/23 1900  BP: 108/64 (!) 93/43  Pulse: 67 70  Resp: 18   Temp:  36.9 C  SpO2: 99%     Last Pain:  Vitals:   09/03/23 1714  TempSrc:   PainSc: 3                  Fairy POUR Eddith Mentor

## 2023-09-03 NOTE — Anesthesia Procedure Notes (Signed)
 Spinal  Patient location during procedure: OR Start time: 09/03/2023 2:30 PM End time: 09/03/2023 2:32 PM Staffing Performed: resident/CRNA  Resident/CRNA: Brien Sotero PARAS, CRNA Performed by: Brien Sotero PARAS, CRNA Authorized by: Mazzoni, Andrea, MD   Preanesthetic Checklist Completed: patient identified, IV checked, site marked, risks and benefits discussed, surgical consent, monitors and equipment checked, pre-op evaluation and timeout performed Spinal Block Patient position: sitting Prep: ChloraPrep Patient monitoring: heart rate, continuous pulse ox and blood pressure Approach: midline Location: L3-4 Injection technique: single-shot Needle Needle type: Pencan  Needle gauge: 25 G Assessment Events: CSF return

## 2023-09-04 ENCOUNTER — Encounter: Payer: Self-pay | Admitting: Obstetrics and Gynecology

## 2023-09-04 NOTE — Op Note (Signed)
 NAMEKARYNA, [name] [name] MEDICAL RECORD NO: [id number] ACCOUNT NO: [id number] DATE OF BIRTH: 1970/08/23 FACILITY: [facility] LOCATION: [facility] PH[facility]ICIAN: Debby DOROTHA Dinsmore, MD  Operative Report   DATE OF PROCEDURE: 09/03/2023   PREOPERATIVE DIAGNOSES: 1. Menorrhagia. 2. Submucosal fibroids or polyps.  POSTOPERATIVE DIAGNOSES: 1. Menorrhagia. 2. Fibroid uterus. 3. Submucosal fibroids.  PROCEDURE: 1. Total vaginal hysterectomy. 2. Bilateral salpingectomy.  SURGEON: Debby DOROTHA Dinsmore, MD  FIRST ASSISTANT: Beverli Dinsmore, MD  SECOND ASSISTANT: CST.  ANESTHESIA: Spinal.  INDICATIONS: A 53 year old gravida II, para 1 patient with a long history of menorrhagia. The patient underwent an ultrasound that demonstrated fibroid uterus and endometrial polyps or submucosal fibroids. The patient elected for definitive surgery.  DESCRIPTION OF PROCEDURE: After adequate spinal anesthesia, the patient was placed in dorsal supine position with the legs in candy cane stirrups. The patient's abdomen, perineum, and vagina were prepped and draped in normal sterile fashion. A timeout  was performed. The patient did receive 2 g of IV Ancef  and 500 mg of Flagyl  for surgical prophylaxis. Timeout was performed. Straight catheterization of the bladder yielded 100 mL of clear urine. Weighted speculum was then placed in the posterior vaginal  vault and the cervix was grasped with two thyroid tenaculum. The cervix was circumferentially injected with 1% lidocaine  with 1:100,000 epinephrine . A direct posterior colpotomy incision was made upon entry into the posterior cul-de-sac. The peritoneal  edge was attached to the posterior vaginal epithelium. Long build weighted speculum was placed. Uterosacral ligaments were then bilaterally clamped, transected, and suture ligated intact for later identification. Anterior cervix was circumferentially  injected and anterior cervix was circumferentially incised  and the cardinal ligaments were then bilaterally clamped, transected, and suture ligated with 0 Vicryl suture. The anterior cul-de-sac was entered sharply without difficulty. Deaver retractor was  placed within to elevate the bladder anteriorly. Uterine arteries were then bilaterally clamped, transected, and suture ligated with 0 Vicryl suture. Several fibroids were encountered during dissection. One prominent mid lateral left side of the uterus  was grasped and dissected free from the uterus. The cervix was then cored from the   uterine body. The submucosal fibroids were identified during this procedure. Ultimately, the cornua were bilaterally clamped, transected, and  doubly ligated. The uterus remnant with fibroids was delivered. Each fallopian tube was identified, clamped at its proximal portion, transected, and suture ligated with #0 Vicryl suture. Ovaries appeared normal and remained in situ. Good hemostasis was  noted. The vaginal cuff was then closed with a running 0 Vicryl suture and the uterosacral ligaments were plicated centrally and the rest of the cuff was closed with 0 Vicryl suture. Good hemostasis was noted. The peritoneum was then closed in a  pursestring fashion with 2-0 PDS suture and the vaginal cuff was closed with #0 Vicryl suture in a running nonlocking fashion. The uterosacral ligaments were plicated centrally and the rest of the vaginal cuff was closed with the 0 Vicryl suture. Good  hemostasis noted. There were no complications.  ESTIMATED BLOOD LOSS: 25 mL.  INTRAOPERATIVE FLUIDS: 600 mL.  URINE OUTPUT: 100 mL and a re-catheterization at the end of the procedure, additional 50 mL for a total of 150 mL.   DISPOSITION: The patient did receive 30 mg of intravenous Toradol  at the end of the procedure and was taken to the recovery room in good condition.    SUJ D: 09/03/2023 4:09:52 pm T: 09/04/2023 12:53:00 am  JOB: [id number]/ [id number]

## 2023-09-08 ENCOUNTER — Other Ambulatory Visit: Payer: Self-pay | Admitting: Family Medicine

## 2023-09-08 DIAGNOSIS — Z1231 Encounter for screening mammogram for malignant neoplasm of breast: Secondary | ICD-10-CM

## 2023-09-08 LAB — SURGICAL PATHOLOGY

## 2023-10-16 ENCOUNTER — Encounter

## 2023-11-03 ENCOUNTER — Ambulatory Visit
Admission: RE | Admit: 2023-11-03 | Discharge: 2023-11-03 | Disposition: A | Discharge status: Home / Self Care | Source: Ambulatory Visit | Attending: Family Medicine | Admitting: Family Medicine

## 2023-11-03 DIAGNOSIS — Z1231 Encounter for screening mammogram for malignant neoplasm of breast: Secondary | ICD-10-CM | POA: Insufficient documentation

## 2023-11-05 ENCOUNTER — Other Ambulatory Visit: Payer: Self-pay | Admitting: Family Medicine

## 2023-11-05 DIAGNOSIS — Z1231 Encounter for screening mammogram for malignant neoplasm of breast: Secondary | ICD-10-CM

## 2023-11-05 DIAGNOSIS — R2231 Localized swelling, mass and lump, right upper limb: Secondary | ICD-10-CM

## 2023-11-12 ENCOUNTER — Ambulatory Visit
Admission: RE | Admit: 2023-11-12 | Discharge: 2023-11-12 | Disposition: A | Discharge status: Home / Self Care | Source: Ambulatory Visit | Attending: Family Medicine | Admitting: Family Medicine

## 2023-11-12 ENCOUNTER — Encounter: Payer: Self-pay | Admitting: Radiology

## 2023-11-12 DIAGNOSIS — R2231 Localized swelling, mass and lump, right upper limb: Secondary | ICD-10-CM | POA: Diagnosis present

## 2023-11-12 DIAGNOSIS — Z1231 Encounter for screening mammogram for malignant neoplasm of breast: Secondary | ICD-10-CM
